# Patient Record
Sex: Female | Born: 1970
Health system: Southern US, Community
[De-identification: ages and names within clinical notes are randomized; demographics above are authoritative.]

## PROBLEM LIST (undated history)

## (undated) HISTORY — PX: LEEP: SHX91

## (undated) HISTORY — PX: TONSILLECTOMY: SUR1361

---

## 2018-02-20 NOTE — Progress Notes (Signed)
Loretta ScaleZach Yulissa Walker D.O. [facility] [address] [place], [place] [address] Phone: [phone] Subjective:    I Loretta NighKana Walker am serving as a Neurosurgeonscribe for Dr. Antoine PrimasZachary Sten Walker.    CC: Neck pain  BJY:[id number]:Subjective  Loretta Walker is a 48 y.o. female coming in with complaint of neck pain for one year. Has pain with sitting for too long. Patient states that pain can switch from side to side. Patient was underneath her table painting it and hyperextended her neck. Has had massage which did not help. Patient does have history of migraines and does get headaches from massage. Denies any radiating symptoms.       History reviewed. No pertinent past medical history. History reviewed. No pertinent surgical history. Social History   Socioeconomic History  . Marital status: Married    Spouse name: Not on file  . Number of children: Not on file  . Years of education: Not on file  . Highest education level: Not on file  Occupational History  . Not on file  Social Needs  . Financial resource strain: Not on file  . Food insecurity:    Worry: Not on file    Inability: Not on file  . Transportation needs:    Medical: Not on file    Non-medical: Not on file  Tobacco Use  . Smoking status: Not on file  Substance and Sexual Activity  . Alcohol use: Not on file  . Drug use: Not on file  . Sexual activity: Not on file  Lifestyle  . Physical activity:    Days per week: Not on file    Minutes per session: Not on file  . Stress: Not on file  Relationships  . Social connections:    Talks on phone: Not on file    Gets together: Not on file    Attends religious service: Not on file    Active member of club or organization: Not on file    Attends meetings of clubs or organizations: Not on file    Relationship status: Not on file  Other Topics Concern  . Not on file  Social History Narrative  . Not on file   Not on File History reviewed. No pertinent family  history.       Current Outpatient Medications (Other):  Marland Kitchen.  Vitamin D, Ergocalciferol, (DRISDOL) 1.25 MG (50000 UT) CAPS capsule, Take 1 capsule (50,000 Units total) by mouth every 7 (seven) days.    Past medical history, social, surgical and family history all reviewed in electronic medical record.  No pertanent information unless stated regarding to the chief complaint.   Review of Systems:  No  visual changes, nausea, vomiting, diarrhea, constipation, dizziness, abdominal pain, skin rash, fevers, chills, night sweats, weight loss, swollen lymph nodes, body aches, joint swelling, chest pain, shortness of breath, mood changes.  Positive muscle aches, headache   Objective  Blood pressure 128/86, pulse 76, height 5' 5.5" (1.664 m), weight 134 lb (60.8 kg), SpO2 99 %.   General: No apparent distress alert and oriented x3 mood and affect normal, dressed appropriately.  HEENT: Pupils equal, extraocular movements intact  Respiratory: Patient's speak in full sentences and does not appear short of breath  Cardiovascular: No lower extremity edema, non tender, no erythema  Skin: Warm dry intact with no signs of infection or rash on extremities or on axial skeleton.  Abdomen: Soft nontender  Neuro: Cranial nerves II through XII are intact, neurovascularly intact in all extremities with 2+  DTRs and 2+ pulses.  Lymph: No lymphadenopathy of posterior or anterior cervical chain or axillae bilaterally.  Gait normal with good balance and coordination.  MSK:  Non tender with full range of motion and good stability and symmetric strength and tone of shoulders, elbows, wrist, hip, knee and ankles bilaterally.  Neck: Inspection left lordosis. No palpable stepoffs. Negative Spurling's maneuver. Limited range of motion lacks 5-10 degrees Grip strength and sensation normal in bilateral hands Strength good C4 to T1 distribution No sensory change to C4 to T1 Negative Hoffman sign bilaterally Reflexes  normal Tightness noted bilaterally trigger point right   [id number]; 15 additional minutes spent for Therapeutic exercises as stated in above notes.  This included exercises focusing on stretching, strengthening, with significant focus on eccentric aspects.   Long term goals include an improvement in range of motion, strength, endurance as well as avoiding reinjury. Patient's frequency would include in 1-2 times a day, 3-5 times a week for a duration of 6-12 weeks.   Exercises that included:  Basic scapular stabilization to include adduction and depression of scapula Scaption, focusing on proper movement and good control Internal and External rotation utilizing a theraband, with elbow tucked at side entire time Rows with theraband given  Proper technique shown and discussed handout in great detail with ATC.  All questions were discussed and answered.      Impression and Recommendations:     This case required medical decision making of moderate complexity. The above documentation has been reviewed and is accurate and complete Loretta Saa, DO       Note: This dictation was prepared with Dragon dictation along with smaller phrase technology. Any transcriptional errors that result from this process are unintentional.

## 2018-02-21 ENCOUNTER — Ambulatory Visit: Payer: Managed Care, Other (non HMO) | Admitting: Family Medicine

## 2018-02-21 ENCOUNTER — Encounter: Payer: Self-pay | Admitting: Family Medicine

## 2018-02-21 ENCOUNTER — Encounter (INDEPENDENT_AMBULATORY_CARE_PROVIDER_SITE_OTHER): Payer: Self-pay

## 2018-02-21 DIAGNOSIS — M542 Cervicalgia: Secondary | ICD-10-CM | POA: Insufficient documentation

## 2018-02-21 MED ORDER — VITAMIN D (ERGOCALCIFEROL) 1.25 MG (50000 UNIT) PO CAPS: 50000.0000 [IU] | ORAL_CAPSULE | ORAL | 0 refills | Status: DC

## 2018-02-21 NOTE — Assessment & Plan Note (Signed)
Neck pain.  Patient has had this for approximately a year.  Patient declined anything such as x-rays at this moment.  Not having any radicular symptoms.  Significant tightness in the parascapular region.  I believe that there is more of a multifactorial problem here.  Some of it is posture related, muscle imbalances, as well as anxiety playing a role.  Started with home exercises, work with Event organiser to learn them in greater detail.  Handouts given.  Patient wanted to do more of a natural approach once weekly vitamin D for muscle strength and endurance, patient also is going to do some over-the-counter topicals.  Follow-up with me again in 4 weeks to see how patient is responding

## 2018-02-21 NOTE — Patient Instructions (Addendum)
Good to see you  Ice 20 minutes 2 times daily. Usually after activity and before bed. Exercises 3 times a week.  Keep hands Wirthlin peripheral vision  2 tennis ball in a tube sock and lay on them where the head meets the neck  Once weekly vitamin D for next 8 weeks  Arnica lotion 2 times a day  See me again in 4 weeks and if doing better then see if manipulation will be a good idea

## 2018-03-21 ENCOUNTER — Ambulatory Visit: Payer: Managed Care, Other (non HMO) | Admitting: Family Medicine

## 2018-04-03 ENCOUNTER — Ambulatory Visit: Payer: Managed Care, Other (non HMO) | Admitting: Family Medicine

## 2018-12-09 ENCOUNTER — Other Ambulatory Visit: Payer: Self-pay | Admitting: Family Medicine

## 2018-12-09 DIAGNOSIS — E049 Nontoxic goiter, unspecified: Secondary | ICD-10-CM

## 2018-12-18 ENCOUNTER — Ambulatory Visit
Admission: RE | Admit: 2018-12-18 | Discharge: 2018-12-18 | Disposition: A | Discharge status: Home / Self Care | Payer: Managed Care, Other (non HMO) | Source: Ambulatory Visit | Attending: Family Medicine | Admitting: Family Medicine

## 2018-12-18 DIAGNOSIS — E049 Nontoxic goiter, unspecified: Secondary | ICD-10-CM

## 2018-12-24 ENCOUNTER — Other Ambulatory Visit: Payer: Self-pay | Admitting: Endocrinology

## 2018-12-24 ENCOUNTER — Other Ambulatory Visit: Payer: Self-pay | Admitting: Family Medicine

## 2018-12-24 DIAGNOSIS — E041 Nontoxic single thyroid nodule: Secondary | ICD-10-CM

## 2018-12-25 ENCOUNTER — Other Ambulatory Visit: Payer: Self-pay | Admitting: Endocrinology

## 2018-12-25 DIAGNOSIS — E041 Nontoxic single thyroid nodule: Secondary | ICD-10-CM

## 2019-01-08 ENCOUNTER — Other Ambulatory Visit: Payer: Managed Care, Other (non HMO)

## 2019-01-13 ENCOUNTER — Other Ambulatory Visit: Payer: Self-pay | Admitting: Endocrinology

## 2019-01-13 DIAGNOSIS — E041 Nontoxic single thyroid nodule: Secondary | ICD-10-CM

## 2019-03-26 ENCOUNTER — Ambulatory Visit
Admission: RE | Admit: 2019-03-26 | Discharge: 2019-03-26 | Disposition: A | Discharge status: Home / Self Care | Payer: Managed Care, Other (non HMO) | Source: Ambulatory Visit | Attending: Endocrinology | Admitting: Endocrinology

## 2019-03-26 DIAGNOSIS — E041 Nontoxic single thyroid nodule: Secondary | ICD-10-CM

## 2019-04-04 ENCOUNTER — Other Ambulatory Visit: Payer: Self-pay | Admitting: Endocrinology

## 2019-04-04 DIAGNOSIS — E041 Nontoxic single thyroid nodule: Secondary | ICD-10-CM

## 2020-02-12 DIAGNOSIS — L814 Other melanin hyperpigmentation: Secondary | ICD-10-CM | POA: Diagnosis not present

## 2020-02-12 DIAGNOSIS — L905 Scar conditions and fibrosis of skin: Secondary | ICD-10-CM | POA: Diagnosis not present

## 2020-02-12 DIAGNOSIS — B078 Other viral warts: Secondary | ICD-10-CM | POA: Diagnosis not present

## 2020-02-12 DIAGNOSIS — D229 Melanocytic nevi, unspecified: Secondary | ICD-10-CM | POA: Diagnosis not present

## 2020-02-12 DIAGNOSIS — L821 Other seborrheic keratosis: Secondary | ICD-10-CM | POA: Diagnosis not present

## 2020-10-28 DIAGNOSIS — L814 Other melanin hyperpigmentation: Secondary | ICD-10-CM | POA: Diagnosis not present

## 2020-10-28 DIAGNOSIS — L821 Other seborrheic keratosis: Secondary | ICD-10-CM | POA: Diagnosis not present

## 2020-10-28 DIAGNOSIS — D2371 Other benign neoplasm of skin of right lower limb, including hip: Secondary | ICD-10-CM | POA: Diagnosis not present

## 2020-10-28 DIAGNOSIS — L720 Epidermal cyst: Secondary | ICD-10-CM | POA: Diagnosis not present

## 2020-11-23 DIAGNOSIS — L815 Leukoderma, not elsewhere classified: Secondary | ICD-10-CM | POA: Diagnosis not present

## 2020-11-23 DIAGNOSIS — L820 Inflamed seborrheic keratosis: Secondary | ICD-10-CM | POA: Diagnosis not present

## 2020-11-23 DIAGNOSIS — L298 Other pruritus: Secondary | ICD-10-CM | POA: Diagnosis not present

## 2020-11-23 DIAGNOSIS — D2371 Other benign neoplasm of skin of right lower limb, including hip: Secondary | ICD-10-CM | POA: Diagnosis not present

## 2020-11-23 DIAGNOSIS — R208 Other disturbances of skin sensation: Secondary | ICD-10-CM | POA: Diagnosis not present

## 2020-11-23 DIAGNOSIS — L538 Other specified erythematous conditions: Secondary | ICD-10-CM | POA: Diagnosis not present

## 2020-12-20 ENCOUNTER — Other Ambulatory Visit (HOSPITAL_COMMUNITY)
Admission: RE | Admit: 2020-12-20 | Discharge: 2020-12-20 | Disposition: A | Discharge status: Home / Self Care | Payer: BC Managed Care – PPO | Source: Ambulatory Visit | Attending: Obstetrics & Gynecology | Admitting: Obstetrics & Gynecology

## 2020-12-20 ENCOUNTER — Encounter: Payer: Self-pay | Admitting: Obstetrics & Gynecology

## 2020-12-20 ENCOUNTER — Ambulatory Visit (INDEPENDENT_AMBULATORY_CARE_PROVIDER_SITE_OTHER): Payer: BC Managed Care – PPO | Admitting: Obstetrics & Gynecology

## 2020-12-20 ENCOUNTER — Other Ambulatory Visit: Payer: Self-pay

## 2020-12-20 VITALS — BP 132/69 | HR 75 | Ht 65.25 in | Wt 125.0 lb

## 2020-12-20 DIAGNOSIS — Z01419 Encounter for gynecological examination (general) (routine) without abnormal findings: Secondary | ICD-10-CM | POA: Diagnosis not present

## 2020-12-20 DIAGNOSIS — Z1231 Encounter for screening mammogram for malignant neoplasm of breast: Secondary | ICD-10-CM | POA: Diagnosis not present

## 2020-12-20 DIAGNOSIS — Z1211 Encounter for screening for malignant neoplasm of colon: Secondary | ICD-10-CM

## 2020-12-20 NOTE — Patient Instructions (Signed)
Please schedule a mammogram at one of the following locations:  [place]: [phone]  [facility] [place]:[phone] [address]  

## 2020-12-20 NOTE — Progress Notes (Signed)
   WELL-WOMAN EXAMINATION Patient name: Loretta Walker MRN [id number]  Date of birth: [birth year] Chief Complaint:   Gynecologic Exam  History of Present Illness:   Loretta Walker is a 50 y.o. PM female being seen today for a routine well-woman exam.   Today she notes: no acute complaints or concerns  Of note, not sexually active x 4-48yrs.  Notes considerable dyspareunia- tried medication with no improvement.  Pt requesting small speculum and fast exam as she is very concerned about discomfort.  Referral from [facility].  Pt may be moving to [place], [place] to be closer to her twin sister  No LMP recorded. Patient is postmenopausal.   Last pap ~52yrs ago.  Last mammogram: Declines mammogram and prefers thermography- this has been done yearly. Last colonoscopy: screening options reviewed- interested in cologuard   Depression screen [facility] 2/9 12/20/2020  Decreased Interest 0  Down, Depressed, Hopeless 1  PHQ - 2 Score 1  Altered sleeping 1  Tired, decreased energy 1  Change in appetite 0  Feeling bad or failure about yourself  0  Trouble concentrating 1  Moving slowly or fidgety/restless 0  Suicidal thoughts 0  PHQ-9 Score 4    Review of Systems:   Pertinent items are noted in HPI Denies any headaches, blurred vision, fatigue, shortness of breath, chest pain, abdominal pain, bowel movements, urination.  Pertinent History Reviewed:  Reviewed past medical,surgical, social and family history.  Reviewed problem list, medications and allergies. Physical Assessment:   Vitals:   12/20/20 1130  BP: 132/69  Pulse: 75  Weight: 125 lb (56.7 kg)  Height: 5' 5.25" (1.657 m)  Body mass index is 20.64 kg/m.        Physical Examination:   General appearance - well appearing, and in no distress  Mental status - alert, oriented to person, place, and time  Psych:  She has a normal mood and affect  Skin - warm and dry, normal color, no suspicious lesions noted  Chest -  effort normal, all lung fields clear to auscultation bilaterally  Heart - normal rate and regular rhythm  Neck:  midline trachea, no thyromegaly or nodules  Breasts - breasts appear normal, no suspicious masses, no skin or nipple changes or  axillary nodes  Abdomen - soft, nontender, nondistended, no masses or organomegaly  Pelvic - VULVA: normal appearing vulva with no masses, tenderness or lesions  VAGINA: narrowed introitus, immediately with placement of speculum noted considerable discomfort, atrophic changed noted, CERVIX: normal appearing cervix without discharge or lesions, no CMT.  Exam limited due to discomfort. **SMALL SPECULUM USED  Thin prep pap is done with HR HPV cotesting  UTERUS: uterus is felt to be normal size, shape, consistency and nontender   ADNEXA: No adnexal masses or tenderness noted.  Extremities:  No swelling or varicosities noted  Chaperone: Faith Rogue     Assessment & Plan:  1) Well-Woman Exam -pap collected, concern for inadequate sampling due to difficulty with exam -pt declined mammogram though ordered placed should pt change her mind -Cologuard ordered   Orders Placed This Encounter  Procedures   MM 3D SCREEN BREAST BILATERAL   Cologuard    Meds: No orders of the defined types were placed in this encounter.   Follow-up: Return in about 2 years (around 12/21/2022) for annual.   Myna Hidalgo, DO Attending Obstetrician & Gynecologist, [facility] for [employer], [facility]

## 2020-12-21 LAB — CYTOLOGY - PAP
Comment: NEGATIVE
Diagnosis: NEGATIVE
High risk HPV: NEGATIVE

## 2020-12-23 ENCOUNTER — Telehealth: Payer: Self-pay

## 2020-12-23 NOTE — Telephone Encounter (Signed)
-----   Message from Annamarie Dawley, LPN sent at [phone]  5:09 PM EST -----  ----- Message ----- From: Myna Hidalgo, DO Sent: 12/22/2020   8:17 AM EST To: Annamarie Dawley, LPN  Pap/HPV negative

## 2020-12-23 NOTE — Telephone Encounter (Signed)
Pt returned vm. Two identifiers used. Pt given test results and confirmed understanding.

## 2020-12-23 NOTE — Telephone Encounter (Signed)
2nd attempt to reach pt regarding test results, no answer, left vm

## 2021-02-13 DIAGNOSIS — J069 Acute upper respiratory infection, unspecified: Secondary | ICD-10-CM | POA: Diagnosis not present

## 2021-03-10 DIAGNOSIS — F4322 Adjustment disorder with anxiety: Secondary | ICD-10-CM | POA: Diagnosis not present

## 2021-03-17 DIAGNOSIS — F4322 Adjustment disorder with anxiety: Secondary | ICD-10-CM | POA: Diagnosis not present

## 2021-03-20 IMAGING — US US THYROID
1 series · 12 of 25 positions shown · non-contrast
Comparison: None.

CLINICAL DATA: Goiter

EXAM:
THYROID ULTRASOUND
TECHNIQUE: Ultrasound examination of the thyroid gland and adjacent soft
tissues was performed.

[Series 1: us thyroid · 0.06mm/px · 12 of 73 slices shown]
[im 4/73]
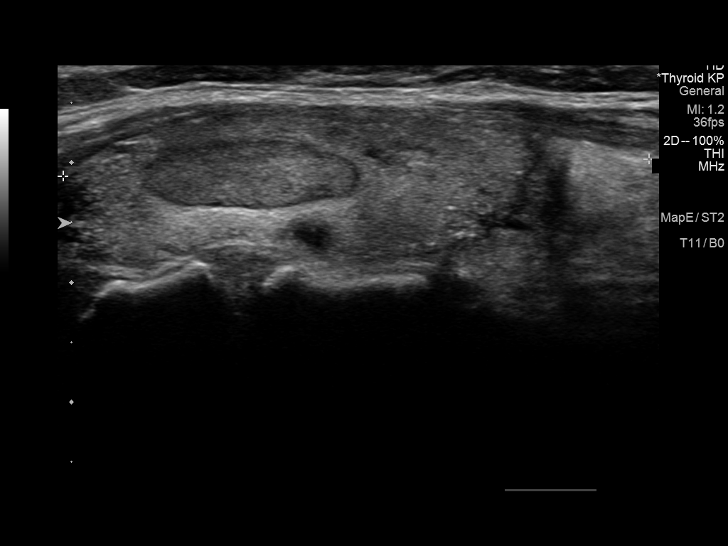
[im 10/73]
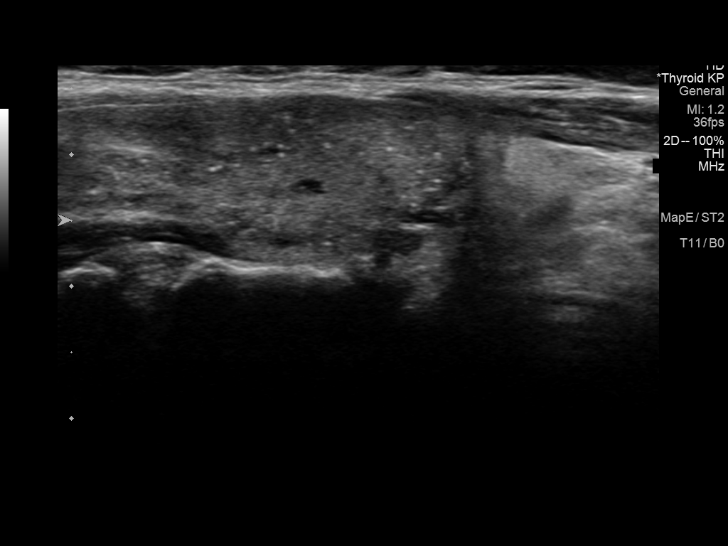
[im 16/73]
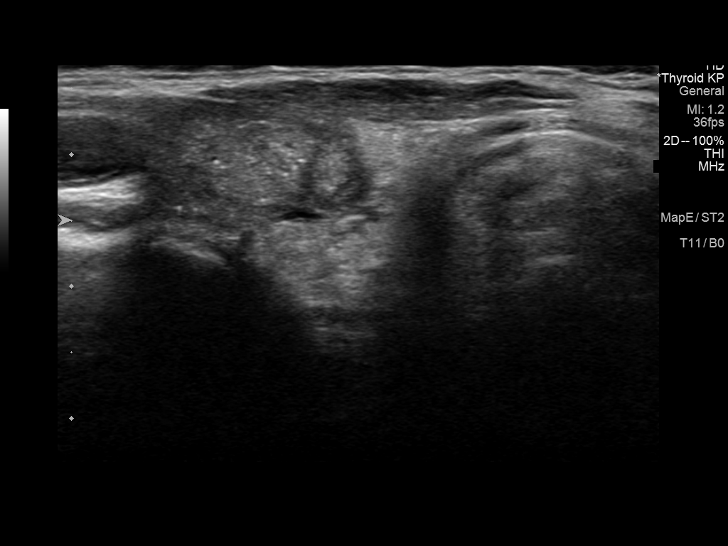
[im 22/73]
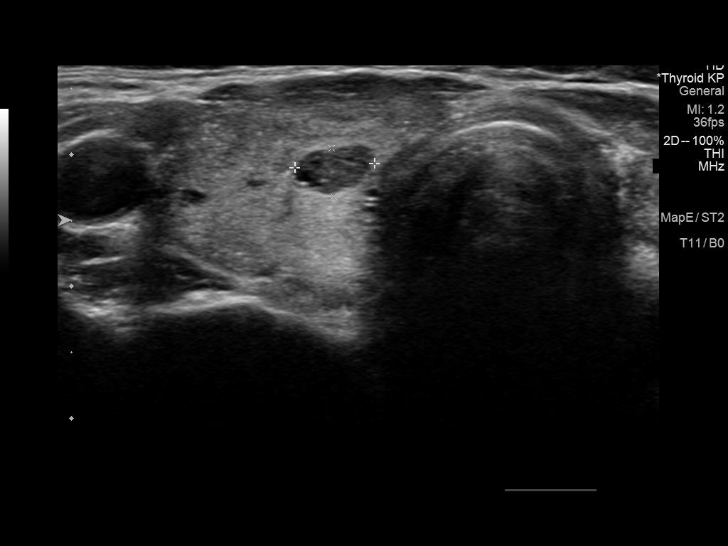
[im 28/73]
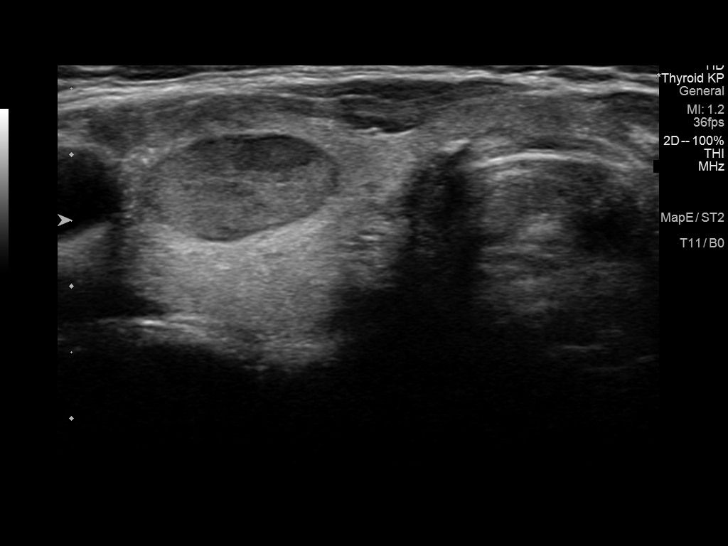
[im 34/73]
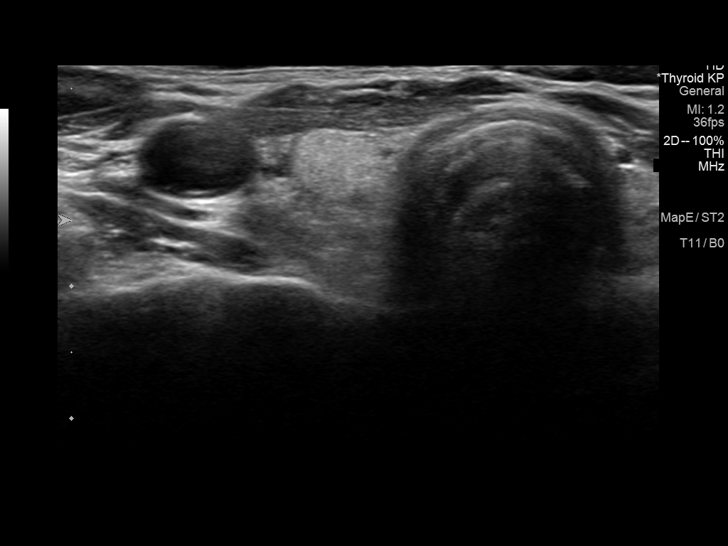
[im 40/73]
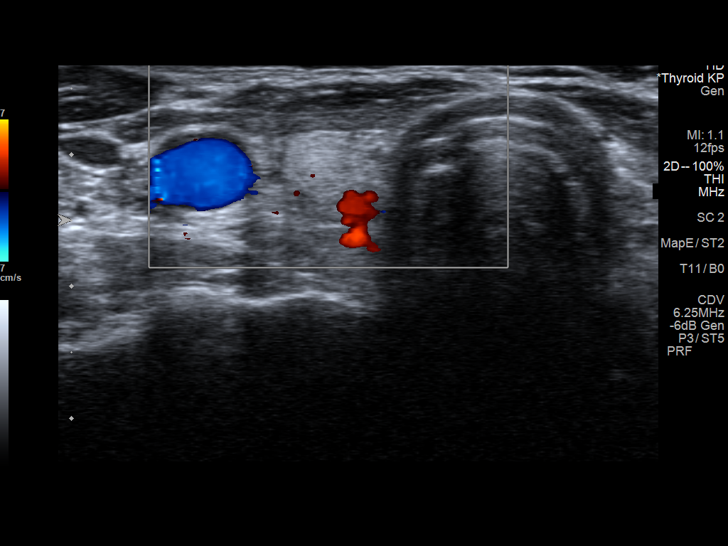
[im 46/73]
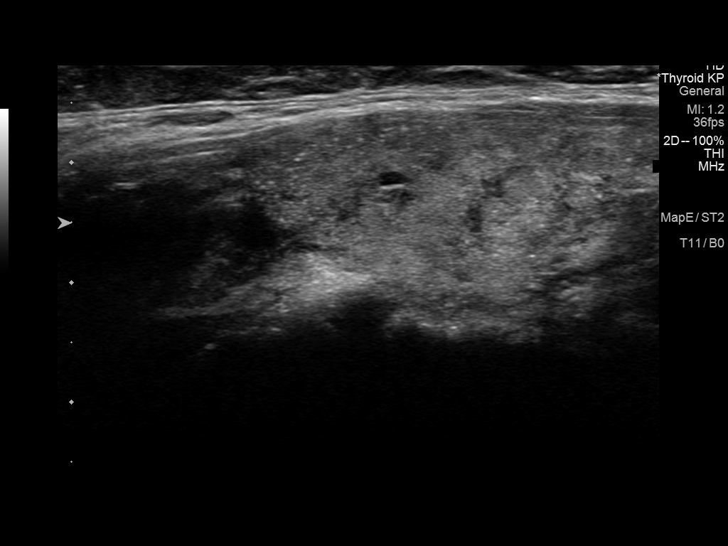
[im 52/73]
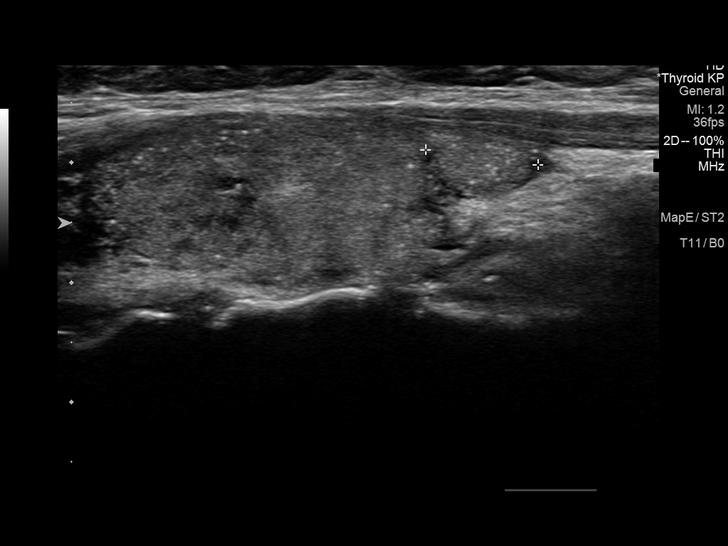
[im 58/73]
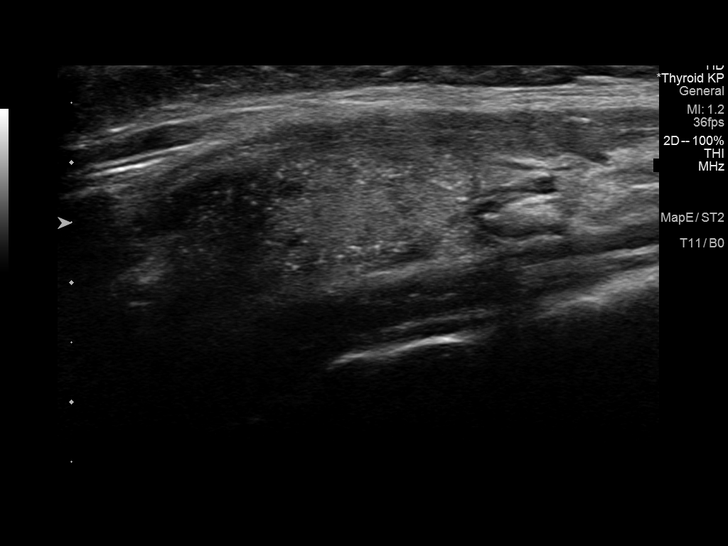
[im 64/73]
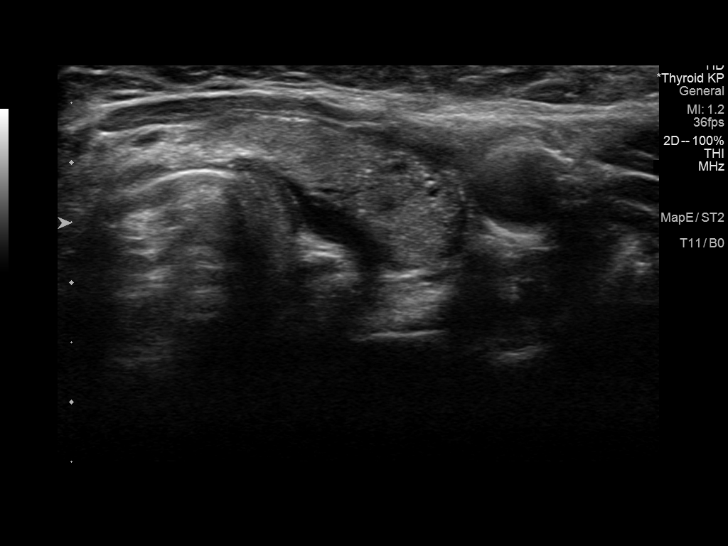
[im 70/73]
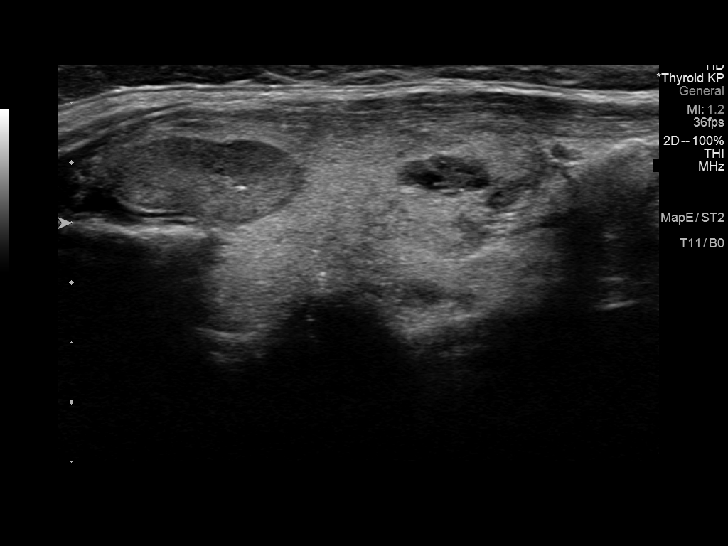

[12 of 25 positions shown; findings below may reference images not displayed]

FINDINGS: Parenchymal Echotexture: Moderately heterogenous

Isthmus: 0.3 cm

Right lobe: 4.9 x 1.7 x 2.2 cm

Left lobe: 3.9 x 1.4 x 1.5 cm

_________________________________________________________

Estimated total number of nodules >/= 1 cm: 4

Number of spongiform nodules >/=  2 cm not described below (TR1): 0

Number of mixed cystic and solid nodules >/= 1.5 cm not described
below (TR2): 0

_________________________________________________________

Nodule # 1:

Location: Right; Superior

Maximum size: 1.8 cm; Other 2 dimensions: 0.8 x 1.2 cm

Composition: solid/almost completely solid (2)

Echogenicity: hypoechoic (2)

Shape: not taller-than-wide (0)

Margins: smooth (0)

Echogenic foci: none (0)

ACR TI-RADS total points: 4.

ACR TI-RADS risk category: TR4 (4-6 points).

ACR TI-RADS recommendations:

**Given size (>/= 1.5 cm) and appearance, fine needle aspiration of
this moderately suspicious nodule should be considered based on
TI-RADS criteria.

_________________________________________________________

Nodule # 2:

Location: Right; Inferior

Maximum size: 1 cm; Other 2 dimensions: 0.4 x 0.8 cm

Composition: solid/almost completely solid (2)

Echogenicity: very hypoechoic (3)

Shape: not taller-than-wide (0)

Margins: smooth (0)

Echogenic foci: none (0)

ACR TI-RADS total points: 5.

ACR TI-RADS risk category: TR4 (4-6 points).

ACR TI-RADS recommendations:

*Given size (>/= 1 - 1.4 cm) and appearance, a follow-up ultrasound
in 1 year should be considered based on TI-RADS criteria.

_________________________________________________________

Nodule # 3:

Location: Right; Inferior

Maximum size: 2.1 cm; Other 2 dimensions: 0.9 x 0.9 cm

Composition: solid/almost completely solid (2)

Echogenicity: hypoechoic (2)

Shape: not taller-than-wide (0)

Margins: smooth (0)

Echogenic foci: punctate echogenic foci (3)

ACR TI-RADS total points: 7.

ACR TI-RADS risk category: TR5 (>/= 7 points).

ACR TI-RADS recommendations:

**Given size (>/= 1.0 cm) and appearance, fine needle aspiration of
this highly suspicious nodule should be considered based on TI-RADS
criteria.

_________________________________________________________

Nodule # 4:

Location: Left; Inferior

Maximum size: 1 cm; Other 2 dimensions: 0.5 x 0 point cm

Composition: solid/almost completely solid (2)

Echogenicity: hypoechoic (2)

Shape: not taller-than-wide (0)

Margins: smooth (0)

Echogenic foci: punctate echogenic foci (3)

ACR TI-RADS total points: 7.

ACR TI-RADS risk category: TR5 (>/= 7 points).

ACR TI-RADS recommendations:

**Given size (>/= 1.0 cm) and appearance, fine needle aspiration of
this highly suspicious nodule should be considered based on TI-RADS
criteria.

_________________________________________________________
No enlarged lymph nodes were identified.
IMPRESSION: 1. Moderately heterogeneous thyroid gland with multiple distinct
thyroid nodules as detailed above.
2. Nodules labeled #1, 3 and 4 meet criteria for fine-needle
aspiration. Nodules labeled #3 in the right inferior thyroid gland
and #4 in the left inferior thyroid gland are the most suspicious on
today's exam.
3. Nodule labeled #2 meets criteria for 1 year follow-up ultrasound.

The above is in keeping with the ACR TI-RADS recommendations - [HOSPITAL] 3376;[DATE].

## 2021-03-24 DIAGNOSIS — F4322 Adjustment disorder with anxiety: Secondary | ICD-10-CM | POA: Diagnosis not present

## 2021-03-31 DIAGNOSIS — F4322 Adjustment disorder with anxiety: Secondary | ICD-10-CM | POA: Diagnosis not present

## 2021-04-11 DIAGNOSIS — H02811 Retained foreign body in right upper eyelid: Secondary | ICD-10-CM | POA: Diagnosis not present

## 2021-04-14 DIAGNOSIS — F4322 Adjustment disorder with anxiety: Secondary | ICD-10-CM | POA: Diagnosis not present

## 2021-10-13 DIAGNOSIS — D225 Melanocytic nevi of trunk: Secondary | ICD-10-CM | POA: Diagnosis not present

## 2021-10-13 DIAGNOSIS — L814 Other melanin hyperpigmentation: Secondary | ICD-10-CM | POA: Diagnosis not present

## 2021-10-13 DIAGNOSIS — L821 Other seborrheic keratosis: Secondary | ICD-10-CM | POA: Diagnosis not present

## 2022-06-07 ENCOUNTER — Ambulatory Visit (HOSPITAL_COMMUNITY): Payer: Self-pay

## 2022-06-08 ENCOUNTER — Telehealth (HOSPITAL_COMMUNITY): Payer: Self-pay | Admitting: Emergency Medicine

## 2022-06-08 ENCOUNTER — Ambulatory Visit (HOSPITAL_COMMUNITY)
Admission: RE | Admit: 2022-06-08 | Discharge: 2022-06-08 | Disposition: A | Discharge status: Home / Self Care | Payer: BC Managed Care – PPO | Source: Ambulatory Visit | Attending: Emergency Medicine | Admitting: Emergency Medicine

## 2022-06-08 ENCOUNTER — Encounter (HOSPITAL_COMMUNITY): Payer: Self-pay

## 2022-06-08 VITALS — BP 113/64 | HR 91 | Temp 98.0°F | Resp 17

## 2022-06-08 DIAGNOSIS — J0140 Acute pansinusitis, unspecified: Secondary | ICD-10-CM | POA: Diagnosis not present

## 2022-06-08 MED ORDER — MOMETASONE FUROATE 50 MCG/ACT NA SUSP: 2.0000 | Freq: Every day | NASAL | 0 refills | Status: DC

## 2022-06-08 MED ORDER — ONDANSETRON 8 MG PO TBDP: ORAL_TABLET | ORAL | 0 refills | Status: DC

## 2022-06-08 MED ORDER — NAPROXEN 500 MG PO TABS: 500.0000 mg | ORAL_TABLET | Freq: Two times a day (BID) | ORAL | 0 refills | Status: DC

## 2022-06-08 MED ORDER — AZITHROMYCIN 200 MG/5ML PO SUSR: 500.0000 mg | Freq: Every day | ORAL | 0 refills | Status: AC

## 2022-06-08 MED ORDER — DOXYCYCLINE MONOHYDRATE 25 MG/5ML PO SUSR: 100.0000 mg | Freq: Two times a day (BID) | ORAL | 0 refills | Status: AC

## 2022-06-08 NOTE — Telephone Encounter (Signed)
Patient called, requesting azithromycin due to doxycycline allergy.  Will call in 5 days of azithromycin, 500 mg on day 1, 250 mg on days 2 through 4.

## 2022-06-08 NOTE — ED Triage Notes (Signed)
Pt reports a cough, headache, jaw soreness, head pressure, nasal congestion and fatigue since Tuesday. States she vomited all night on Monday and symptoms started with a sore throat on Saturday night. Sore throat has since then resolved. Has tried an Ibuprofen and flu and cough medication.

## 2022-06-08 NOTE — Discharge Instructions (Signed)
Start Mucinex-D to keep the mucous thin and to decongest you.   You may take the Naprosyn with 1000 mg of Tylenol twice a day as needed for headache, pain this is an effective combination for pain.  Finish the doxycycline, even if you feel better.  Try the Nasonex.  Use a NeilMed sinus rinse with distilled water as often as you want to to reduce nasal congestion. Follow the directions on the box.   Zofran is in case you start having vomiting and diarrhea again.  Make sure you push plenty of extra electrolyte containing fluids  Go to www.goodrx.com to look up your medications. This will give you a list of where you can find your prescriptions at the most affordable prices. Or you can ask the pharmacist what the cash price is. This is frequently cheaper than going through insurance.

## 2022-06-08 NOTE — ED Provider Notes (Signed)
HPI  SUBJECTIVE:  Loretta Walker is a 52 y.o. female who presents with sore throat for 6 days, right-sided headache, right-sided sinus pain and pressure, nasal congestion, clear rhinorrhea, chest congestion, cough productive of green sputum, right jaw pain.  She reports vomiting and diarrhea 3 days ago, but this has resolved.  She is tolerating p.o. now.  No upper dental pain, facial swelling, fevers, postnasal drip.  No wheezing, shortness of breath.  No antibiotics in the past month.  She has been taking ibuprofen 200 mg, and unknown cold and flu medication and ice.  The ibuprofen helps.  Symptoms are worse with bending forward.  She has a past medical history of migraines, status post tonsillectomy and sinusitis.  No history of TMJ arthralgia. PCP: None.    History reviewed. No pertinent past medical history.  Past Surgical History:  Procedure Laterality Date   LEEP     TONSILLECTOMY      Family History  Problem Relation Age of Onset   Breast cancer Maternal Grandmother    Heart disease Mother     Social History   Tobacco Use   Smoking status: Never    Passive exposure: Never   Smokeless tobacco: Never  Vaping Use   Vaping Use: Never used  Substance Use Topics   Alcohol use: Never   Drug use: Never    No current facility-administered medications for this encounter.  Current Outpatient Medications:    doxycycline (VIBRAMYCIN) 25 MG/5ML SUSR, Take 20 mLs (100 mg total) by mouth 2 (two) times daily for 10 days., Disp: 240 mL, Rfl: 0   mometasone (NASONEX) 50 MCG/ACT nasal spray, Place 2 sprays into the nose daily., Disp: 17 g, Rfl: 0   naproxen (NAPROSYN) 500 MG tablet, Take 1 tablet (500 mg total) by mouth 2 (two) times daily., Disp: 20 tablet, Rfl: 0   ondansetron (ZOFRAN-ODT) 8 MG disintegrating tablet, 1/2- 1 tablet q 8 hr prn nausea, vomiting, Disp: 20 tablet, Rfl: 0   azithromycin (ZITHROMAX) 200 MG/5ML suspension, Take 12.5 mLs (500 mg total) by mouth daily for 4  days. Take 12.5 mL on day #1, 6 mL on days 2 through 4, Disp: 50 mL, Rfl: 0  Allergies  Allergen Reactions   Codeine Rash   Doxycycline Rash   Latex Rash   Penicillins Rash     ROS  As noted in HPI.   Physical Exam  BP 113/64 (BP Location: Right Arm)   Pulse 91   Temp 98 F (36.7 C) (Oral)   Resp 17   SpO2 98%   Constitutional: Well developed, well nourished, no acute distress Eyes:  EOMI, conjunctiva normal bilaterally HENT: Normocephalic, atraumatic,mucus membranes moist.  Right external ear, EAC, TM normal.  No tenderness over the TMJ.  No pain with traction on pinna, palpation of tragus or mastoid.  Erythematous, swollen turbinates with nasal congestion.  No maxillary, frontal sinus tenderness.  Positive cobblestoning.  Tonsils surgically absent. Respiratory: Normal inspiratory effort bilaterally, good air movement Cardiovascular: Normal rate, regular rhythm, no murmurs GI: nondistended skin: No rash, skin intact Musculoskeletal: no deformities Neurologic: Alert & oriented x 3, no focal neuro deficits Psychiatric: Speech and behavior appropriate   ED Course   Medications - No data to display  No orders of the defined types were placed in this encounter.   No results found for this or any previous visit (from the past 24 hour(s)). No results found.  ED Clinical Impression  1. Acute non-recurrent pansinusitis  ED Assessment/Plan     Patient states that she has tolerated doxycycline before.   Presentation consistent with upper respiratory infection that has turned into a sinus infection.  Will send home with 10 days of doxycycline liquid as she states she cannot take pills, saline nasal irrigation, Naprosyn/Tylenol, Nasonex, Mucinex D.  States Flonase gives her migraines.  Will also send home with Zofran in case the vomiting and diarrhea return.  She states this has resolved for the past few days.  She will follow-up with a primary care provider of her  choice.  She declined primary care list.  1500-patient called requesting azithromycin due to doxycycline allergy.  Will E prescribe azithromycin although this may not be as effective as the doxycycline.  See telephone note.  Discussed  MDM, treatment plan, and plan for follow-up with patient. Discussed sn/sx that should prompt return to the ED. patient agrees with plan.   Meds ordered this encounter  Medications   mometasone (NASONEX) 50 MCG/ACT nasal spray    Sig: Place 2 sprays into the nose daily.    Dispense:  17 g    Refill:  0   naproxen (NAPROSYN) 500 MG tablet    Sig: Take 1 tablet (500 mg total) by mouth 2 (two) times daily.    Dispense:  20 tablet    Refill:  0   doxycycline (VIBRAMYCIN) 25 MG/5ML SUSR    Sig: Take 20 mLs (100 mg total) by mouth 2 (two) times daily for 10 days.    Dispense:  240 mL    Refill:  0   ondansetron (ZOFRAN-ODT) 8 MG disintegrating tablet    Sig: 1/2- 1 tablet q 8 hr prn nausea, vomiting    Dispense:  20 tablet    Refill:  0      *This clinic note was created using Scientist, clinical (histocompatibility and immunogenetics). Therefore, there may be occasional mistakes despite careful proofreading.  ?    Domenick Gong, MD 06/10/22 1144

## 2022-06-29 ENCOUNTER — Encounter: Payer: Self-pay | Admitting: Family

## 2022-06-29 ENCOUNTER — Ambulatory Visit: Payer: BC Managed Care – PPO | Admitting: Family

## 2022-06-29 VITALS — BP 109/73 | HR 75 | Temp 97.3°F | Ht 65.0 in | Wt 129.1 lb

## 2022-06-29 DIAGNOSIS — K644 Residual hemorrhoidal skin tags: Secondary | ICD-10-CM

## 2022-06-29 DIAGNOSIS — G43009 Migraine without aura, not intractable, without status migrainosus: Secondary | ICD-10-CM

## 2022-06-29 MED ORDER — NAPROXEN 125 MG/5ML PO SUSP
500.0000 mg | ORAL | 0 refills | Status: DC
Start: 2022-06-29 — End: 2023-02-22

## 2022-06-29 MED ORDER — SUMATRIPTAN SUCCINATE 25 MG PO TABS: 25.0000 mg | ORAL_TABLET | ORAL | 0 refills | Status: DC | PRN

## 2022-06-29 MED ORDER — NAPROXEN 500 MG PO TABS: 500.0000 mg | ORAL_TABLET | Freq: Every day | ORAL | 0 refills | Status: DC | PRN

## 2022-06-29 MED ORDER — SUMATRIPTAN 10 MG/ACT NA SOLN
1.0000 | NASAL | 2 refills | Status: AC
Start: 2022-06-29 — End: ?

## 2022-06-29 MED ORDER — NAPROXEN 500 MG PO TABS: 500.0000 mg | ORAL_TABLET | Freq: Two times a day (BID) | ORAL | 0 refills | Status: DC

## 2022-06-29 NOTE — Patient Instructions (Signed)
Welcome to [facility], It was a pleasure meeting you today!    As discussed, I have sent over the generic Imitrex to try for your next migraine. Take 1 pill with one Naproxen (also sent to pharmacy). Follow instructions on the Imitrex bottle.   Ask your dermatologist about removing the left over skin tag tissue from your old hemorroids.   You can schedule a physical with fasting l when convenient for you today.    PLEASE NOTE: If you had any LAB tests please let us know if you have not heard back within a few days. You may see your results on MyChart before we have a chance to review them but we will give you a call once they are reviewed by [place]. If we ordered any REFERRALS today, please let us know if you have not heard from their office within the next week.  Let us know through MyChart if you are needing REFILLS, or have your pharmacy send [place] the request. You can also use MyChart to communicate with me or any office staff.  Please try these tips to maintain a healthy lifestyle: It is important that you exercise regularly at least 30 minutes 5 times a week. Think about what you will eat, plan ahead. Choose whole foods, & think  "clean, green, fresh or frozen" over canned, processed or packaged foods which are more sugary, salty, and fatty. 70 to 75% of food eaten should be fresh vegetables and protein. 2-3  meals daily with healthy snacks between meals, but must be whole fruit, protein or vegetables. Aim to eat over a 10 hour period when you are active, for example, 7am to 5pm, and then STOP after your last meal of the day, drinking only water.  Shorter eating windows, 6-8 hours, are showing benefits in heart disease and blood sugar regulation. Drink water every day! Shoot for 64 ounces daily = 8 cups, no other drink is as healthy! Fruit juice is best enjoyed in a healthy way, by EATING the fruit.

## 2022-06-29 NOTE — Progress Notes (Signed)
New Patient Office Visit  Subjective:  Patient ID: Loretta Walker, female    DOB: 1970-06-26  Age: 52 y.o. MRN: [id number]  CC:  Chief Complaint  Patient presents with   New Patient (Initial Visit)    No concerns   Migraine    Discuss migraines, present for years. Once a month and usually lasts 3 days and head pressure.    Hemorrhoids    Pt c/o hemrrhoids, Has tried lidocaine wipes which does relieve It.    HPI Loretta Walker presents for establishing care today.  Migraines:  occurs about every 1-2 months and last 3 days, she thinks it is related to when the barometric pressure drops around storms or large amount of rainfall. Reports pain on right side and down the back of her head, sharp, stabbing pain. Has tried OTC meds including Excedrin migraine that does not really help, she has to lay down, can not work or focus, photosensitive. Denies nausea.  Hemorrhoid tissue:  pt states she does not have a current hemorrhoid but used to get them frequently in the past and now has extra loose skin/tissue around her rectum that she would like removed.   Assessment & Plan:  Migraine without aura and without status migrainosus, not intractable Assessment & Plan: chronic every month or other month, right side of head, sharp/stabbing, lasts for 3 days no OTC meds help never has tried RX med in past, was nervous about SE sending generic Imitrex nasal spray along with Naproxen suspension (pt unable to swallow pills), advised on use & SE. f/u 6 mos or prn  Orders: -     SUMAtriptan; Place 1 spray into the nose as directed. Spray into one nostril only. Repeat in 2 hours if needed, max dose of 4 sprays in 24 hours.  Dispense: 1 each; Refill: 2 -     Naproxen; Take 20 mLs (500 mg total) by mouth as directed. Take 20ml with sumatriptan nasal spray for migraines. Can repeat dose in 12 hours if needed.  Dispense: 150 mL; Refill: 0  Hemorrhoidal skin tags - did not assess, pt thinks  there are at least 2 different skin tags, reports they get irritated sometimes when she wipes her rectum. Denies any current hemorrhoids, pain or itching. States she now avoids constipation, has a soft BM daily. Advised pt to contact her DERM and see if able to do, if not, let me know and I can refer to general surgery.  Subjective:    Outpatient Medications Prior to Visit  Medication Sig Dispense Refill   mometasone (NASONEX) 50 MCG/ACT nasal spray Place 2 sprays into the nose daily. 17 g 0   naproxen (NAPROSYN) 500 MG tablet Take 1 tablet (500 mg total) by mouth 2 (two) times daily. 20 tablet 0   ondansetron (ZOFRAN-ODT) 8 MG disintegrating tablet 1/2- 1 tablet q 8 hr prn nausea, vomiting 20 tablet 0   No facility-administered medications prior to visit.   No past medical history on file. Past Surgical History:  Procedure Laterality Date   LEEP     TONSILLECTOMY      Objective:   Today's Vitals: BP 109/73   Pulse 75   Temp (!) 97.3 F (36.3 C) (Temporal)   Ht 5\' 5"  (1.651 m)   Wt 129 lb 2 oz (58.6 kg)   SpO2 97%   BMI 21.49 kg/m   Physical Exam Vitals and nursing note reviewed.  Constitutional:      Appearance: Normal appearance.  Cardiovascular:  Rate and Rhythm: Normal rate and regular rhythm.  Pulmonary:     Effort: Pulmonary effort is normal.     Breath sounds: Normal breath sounds.  Musculoskeletal:        General: Normal range of motion.  Skin:    General: Skin is warm and dry.  Neurological:     Mental Status: She is alert.  Psychiatric:        Mood and Affect: Mood normal.        Behavior: Behavior normal.     Meds ordered this encounter  Medications   DISCONTD: SUMAtriptan (IMITREX) 25 MG tablet    Sig: Take 1 tablet (25 mg total) by mouth every 2 (two) hours as needed for migraine. May repeat in 2 hours if headache persists or recurs.    Dispense:  10 tablet    Refill:  0    Order Specific Question:   Supervising Provider    Answer:   ANDY,  CAMILLE L [[id number]]   DISCONTD: naproxen (NAPROSYN) 500 MG tablet    Sig: Take 1 tablet (500 mg total) by mouth 2 (two) times daily with a meal.    Dispense:  30 tablet    Refill:  0    Order Specific Question:   Supervising Provider    Answer:   ANDY, CAMILLE L [[id number]]   DISCONTD: SUMAtriptan (IMITREX) 25 MG tablet    Sig: Take 1-2 tablets (25-50 mg total) by mouth every 2 (two) hours as needed for migraine (Max dose of 6 pills in 24 hours). May repeat in 2 hours if headache persists or recurs. Take with 1 Naproxen.    Dispense:  10 tablet    Refill:  0    Order Specific Question:   Supervising Provider    Answer:   ANDY, CAMILLE L [[id number]]   DISCONTD: naproxen (NAPROSYN) 500 MG tablet    Sig: Take 1 tablet (500 mg total) by mouth daily as needed for headache or moderate pain. Take with the Imitrex for migraine.    Dispense:  30 tablet    Refill:  0    Order Specific Question:   Supervising Provider    Answer:   ANDY, CAMILLE L [[id number]]   SUMAtriptan 10 MG/ACT SOLN    Sig: Place 1 spray into the nose as directed. Spray into one nostril only. Repeat in 2 hours if needed, max dose of 4 sprays in 24 hours.    Dispense:  1 each    Refill:  2    d/c sumatriptan pills    Order Specific Question:   Supervising Provider    Answer:   ANDY, CAMILLE L [[id number]]   naproxen (NAPROSYN) 125 MG/5ML suspension    Sig: Take 20 mLs (500 mg total) by mouth as directed. Take 20ml with sumatriptan nasal spray for migraines. Can repeat dose in 12 hours if needed.    Dispense:  150 mL    Refill:  0    d/c previous order for pills    Order Specific Question:   Supervising Provider    Answer:   ANDY, CAMILLE L [[id number]]    Dulce Sellar, NP

## 2022-06-29 NOTE — Assessment & Plan Note (Signed)
chronic every month or other month, right side of head, sharp/stabbing, lasts for 3 days no OTC meds help never has tried RX med in past, was nervous about SE sending generic Imitrex nasal spray along with Naproxen suspension (pt unable to swallow pills), advised on use & SE. f/u 6 mos or prn

## 2022-07-12 ENCOUNTER — Ambulatory Visit (INDEPENDENT_AMBULATORY_CARE_PROVIDER_SITE_OTHER): Payer: BC Managed Care – PPO | Admitting: Family

## 2022-07-12 VITALS — BP 127/74 | HR 74 | Temp 98.0°F | Ht 65.0 in | Wt 128.8 lb

## 2022-07-12 DIAGNOSIS — Z1231 Encounter for screening mammogram for malignant neoplasm of breast: Secondary | ICD-10-CM

## 2022-07-12 DIAGNOSIS — Z1211 Encounter for screening for malignant neoplasm of colon: Secondary | ICD-10-CM

## 2022-07-12 DIAGNOSIS — Z114 Encounter for screening for human immunodeficiency virus [HIV]: Secondary | ICD-10-CM

## 2022-07-12 DIAGNOSIS — Z1159 Encounter for screening for other viral diseases: Secondary | ICD-10-CM | POA: Diagnosis not present

## 2022-07-12 DIAGNOSIS — Z0000 Encounter for general adult medical examination without abnormal findings: Secondary | ICD-10-CM

## 2022-07-12 LAB — CBC WITH DIFFERENTIAL/PLATELET
Basophils Absolute: 0.1 10*3/uL (ref 0.0–0.1)
Basophils Relative: 1.1 % (ref 0.0–3.0)
Eosinophils Absolute: 0.2 10*3/uL (ref 0.0–0.7)
Eosinophils Relative: 4.8 % (ref 0.0–5.0)
HCT: 42.6 % (ref 36.0–46.0)
Hemoglobin: 14.2 g/dL (ref 12.0–15.0)
Lymphocytes Relative: 25.3 % (ref 12.0–46.0)
Lymphs Abs: 1.3 10*3/uL (ref 0.7–4.0)
MCHC: 33.4 g/dL (ref 30.0–36.0)
MCV: 89.8 fl (ref 78.0–100.0)
Monocytes Absolute: 0.3 10*3/uL (ref 0.1–1.0)
Monocytes Relative: 6 % (ref 3.0–12.0)
Neutro Abs: 3.1 10*3/uL (ref 1.4–7.7)
Neutrophils Relative %: 62.8 % (ref 43.0–77.0)
Platelets: 234 10*3/uL (ref 150.0–400.0)
RBC: 4.75 Mil/uL (ref 3.87–5.11)
RDW: 12.6 % (ref 11.5–15.5)
WBC: 5 10*3/uL (ref 4.0–10.5)

## 2022-07-12 LAB — COMPREHENSIVE METABOLIC PANEL
ALT: 14 U/L (ref 0–35)
AST: 17 U/L (ref 0–37)
Albumin: 4.5 g/dL (ref 3.5–5.2)
Alkaline Phosphatase: 58 U/L (ref 39–117)
BUN: 13 mg/dL (ref 6–23)
CO2: 29 mEq/L (ref 19–32)
Calcium: 9.4 mg/dL (ref 8.4–10.5)
Chloride: 102 mEq/L (ref 96–112)
Creatinine, Ser: 0.7 mg/dL (ref 0.40–1.20)
GFR: 98.66 mL/min (ref >60.00)
Glucose, Bld: 103 mg/dL — ABNORMAL HIGH (ref 70–99)
Potassium: 3.7 mEq/L (ref 3.5–5.1)
Sodium: 140 mEq/L (ref 135–145)
Total Bilirubin: 0.6 mg/dL (ref 0.2–1.2)
Total Protein: 7.2 g/dL (ref 6.0–8.3)

## 2022-07-12 LAB — LIPID PANEL
Cholesterol: 335 mg/dL — ABNORMAL HIGH (ref 0–200)
HDL: 56.6 mg/dL (ref >39.00)
LDL Cholesterol: 240 mg/dL — ABNORMAL HIGH (ref 0–99)
NonHDL: 278.66
Total CHOL/HDL Ratio: 6
Triglycerides: 195 mg/dL — ABNORMAL HIGH (ref 0.0–149.0)
VLDL: 39 mg/dL (ref 0.0–40.0)

## 2022-07-12 LAB — TSH: TSH: 2.12 u[IU]/mL (ref 0.35–5.50)

## 2022-07-12 NOTE — Patient Instructions (Addendum)
It was very nice to see you today!   I will review your lab results via MyChart in a few days.   Have a great week!    PLEASE NOTE:  If you had any lab tests please let us know if you have not heard back within a few days. You may see your results on MyChart before we have a chance to review them but we will give you a call once they are reviewed by us. If we ordered any referrals today, please let us know if you have not heard from their office within the next week.    

## 2022-07-12 NOTE — Progress Notes (Signed)
Phone [phone]  Subjective:   Patient is a 52 y.o. female presenting for annual physical.    Chief Complaint  Patient presents with   Annual Exam    Fasting w/ labs    See problem oriented charting- ROS- full  review of systems was completed and negative.   The following were reviewed and entered/updated in epic: No past medical history on file. Patient Active Problem List   Diagnosis Date Noted   Migraine without aura and without status migrainosus, not intractable 06/29/2022   Neck pain 02/21/2018   Past Surgical History:  Procedure Laterality Date   LEEP     TONSILLECTOMY      Family History  Problem Relation Age of Onset   Breast cancer Maternal Grandmother    Heart disease Mother     Medications- reviewed and updated Current Outpatient Medications  Medication Sig Dispense Refill   naproxen (NAPROSYN) 125 MG/5ML suspension Take 20 mLs (500 mg total) by mouth as directed. Take 20ml with sumatriptan nasal spray for migraines. Can repeat dose in 12 hours if needed. 150 mL 0   SUMAtriptan 10 MG/ACT SOLN Place 1 spray into the nose as directed. Spray into one nostril only. Repeat in 2 hours if needed, max dose of 4 sprays in 24 hours. 1 each 2   No current facility-administered medications for this visit.    Allergies-reviewed and updated Allergies  Allergen Reactions   Codeine Rash   Doxycycline Rash   Latex Rash   Penicillins Rash    Social History   Social History Narrative   Not on file    Objective:  Ht 5\' 5"  (1.651 m)   Wt 128 lb 12.8 oz (58.4 kg)   BMI 21.43 kg/m  Physical Exam Vitals and nursing note reviewed.  Constitutional:      Appearance: Normal appearance.  HENT:     Head: Normocephalic.     Right Ear: Tympanic membrane normal.     Left Ear: Tympanic membrane normal.     Nose: Nose normal.     Mouth/Throat:     Mouth: Mucous membranes are moist.  Eyes:     Pupils: Pupils are equal, round, and reactive to light.   Cardiovascular:     Rate and Rhythm: Normal rate and regular rhythm.  Pulmonary:     Effort: Pulmonary effort is normal.     Breath sounds: Normal breath sounds.  Musculoskeletal:        General: Normal range of motion.     Cervical back: Normal range of motion.  Lymphadenopathy:     Cervical: No cervical adenopathy.  Skin:    General: Skin is warm and dry.  Neurological:     Mental Status: She is alert.  Psychiatric:        Mood and Affect: Mood normal.        Behavior: Behavior normal.      Assessment and Plan   Health Maintenance counseling: 1. Anticipatory guidance: Patient counseled regarding regular dental exams q6 months, eye exams,  avoiding smoking and second hand smoke, limiting alcohol to 1 beverage per day, no illicit drugs.   2. Risk factor reduction:  Advised patient of need for regular exercise and diet rich with fruits and vegetables to reduce risk of heart attack and stroke. Exercise- walking.  Wt Readings from Last 3 Encounters:  07/12/22 128 lb 12.8 oz (58.4 kg)  06/29/22 129 lb 2 oz (58.6 kg)  12/20/20 125 lb (56.7 kg)   3. Immunizations/screenings/ancillary  studies  There is no immunization history on file for this patient. Health Maintenance Due  Topic Date Due   Hepatitis C Screening  Never done    4. Cervical cancer screening- due 2025 5. Breast cancer screening-  mammogram 2023 6. Colon cancer screening - not done yet, pt will consider, no risk factors   7. Skin cancer screening- advised regular sunscreen use. Denies worrisome, changing, or new skin lesions.  8. Birth control/STD check- N/A 9. Osteoporosis screening- N/A 10. Alcohol screening: none 11. Smoking associated screening (lung cancer screening, AAA screen 65-75, UA)- non- smoker  Annual physical exam - pt requesting to know her blood type, advised this is not a covered lab and she will need to pay out of pocket, pt verbalized understanding.  -     CBC with Differential/Platelet -      Comprehensive metabolic panel -     Lipid panel -     TSH  Need for hepatitis C screening test -     Hepatitis C antibody   Recommended follow up:  Return for any future concerns. No future appointments.  Lab/Order associations: fasting   Dulce Sellar, NP

## 2022-07-13 LAB — HEPATITIS C ANTIBODY: Hepatitis C Ab: NONREACTIVE

## 2022-07-14 ENCOUNTER — Encounter: Payer: Self-pay | Admitting: Family

## 2022-07-14 DIAGNOSIS — E782 Mixed hyperlipidemia: Secondary | ICD-10-CM | POA: Insufficient documentation

## 2022-08-23 DIAGNOSIS — L820 Inflamed seborrheic keratosis: Secondary | ICD-10-CM | POA: Diagnosis not present

## 2022-08-23 DIAGNOSIS — L298 Other pruritus: Secondary | ICD-10-CM | POA: Diagnosis not present

## 2022-08-23 DIAGNOSIS — L538 Other specified erythematous conditions: Secondary | ICD-10-CM | POA: Diagnosis not present

## 2022-08-23 DIAGNOSIS — D492 Neoplasm of unspecified behavior of bone, soft tissue, and skin: Secondary | ICD-10-CM | POA: Diagnosis not present

## 2022-10-19 DIAGNOSIS — D225 Melanocytic nevi of trunk: Secondary | ICD-10-CM | POA: Diagnosis not present

## 2022-10-19 DIAGNOSIS — L720 Epidermal cyst: Secondary | ICD-10-CM | POA: Diagnosis not present

## 2022-10-19 DIAGNOSIS — L821 Other seborrheic keratosis: Secondary | ICD-10-CM | POA: Diagnosis not present

## 2022-10-19 DIAGNOSIS — L814 Other melanin hyperpigmentation: Secondary | ICD-10-CM | POA: Diagnosis not present

## 2022-10-19 DIAGNOSIS — L700 Acne vulgaris: Secondary | ICD-10-CM | POA: Diagnosis not present

## 2023-02-22 ENCOUNTER — Encounter: Payer: Self-pay | Admitting: Family

## 2023-02-22 ENCOUNTER — Telehealth: Payer: Self-pay

## 2023-02-22 ENCOUNTER — Ambulatory Visit: Payer: BC Managed Care – PPO | Admitting: Family

## 2023-02-22 VITALS — BP 129/72 | HR 65 | Temp 97.3°F | Ht 65.0 in | Wt 147.0 lb

## 2023-02-22 DIAGNOSIS — E782 Mixed hyperlipidemia: Secondary | ICD-10-CM | POA: Diagnosis not present

## 2023-02-22 DIAGNOSIS — G43009 Migraine without aura, not intractable, without status migrainosus: Secondary | ICD-10-CM

## 2023-02-22 MED ORDER — NAPROXEN 125 MG/5ML PO SUSP
500.0000 mg | ORAL | 0 refills | Status: DC
Start: 2023-02-22 — End: 2023-02-26

## 2023-02-22 NOTE — Telephone Encounter (Signed)
Copied from CRM [id number]. Topic: Clinical - Prescription Issue >> Feb 22, 2023  3:23 PM Elizebeth Brooking wrote: Reason for CRM: Patient called in regarding naproxen (NAPROSYN) 125 MG/5ML suspension stated pharmacy and insurance stated that a prior authorization is required  Please start PA for pt.  Thank you!

## 2023-02-22 NOTE — Addendum Note (Signed)
Addended by: Lorn Junes on: 02/22/2023 03:44 PM   Modules accepted: Orders

## 2023-02-22 NOTE — Progress Notes (Signed)
Patient ID: Loretta Walker, female    DOB: 1970/05/12, 53 y.o.   MRN: [id number]  Chief Complaint  Patient presents with   Migraine    Pt would like medication refill of Naproxen.       Discussed the use of AI scribe software for clinical note transcription with the patient, who gave verbal consent to proceed.  History of Present Illness   The patient presents for migraine medication refills. She has been using liquid naproxen since her last visit, which helps reduce the intensity of the migraines but takes a while to work. She has not yet tried the prescribed nasal Imitrex due to nervousness. Migraines have not decreased in frequency since the last visit, occurring approximately once a month, but duration is less, as used to last 3 days. Potential triggers for her migraines include stress, dehydration, skipping meals, and allergies. A recent migraine was possibly triggered by outdoor activity. She is mindful of these triggers and tries to manage them. She has a history of high cholesterol, which she believes is hereditary, as her father also has high cholesterol. She has not made significant dietary changes, as she already follows a healthy diet, having grown up eating organic and vegetarian foods.     Component Value Date/Time   CHOL 335 (H) 07/12/2022 0851   TRIG 195.0 (H) 07/12/2022 0851   HDL 56.60 07/12/2022 0851   CHOLHDL 6 07/12/2022 0851   VLDL 39.0 07/12/2022 0851   LDLCALC 240 (H) 07/12/2022 0851      Assessment & Plan:     Migraine - Patient reports reduced frequency and intensity of migraines with use of liquid Naproxen. Patient has not yet tried Imitrex nasal spray due to apprehension. -Encouraged patient to try Imitrex nasal spray at onset of next migraine along with the Naproxen. -Continue liquid Naproxen as needed for migraines, refill sent today. -Follow up in 6 mos.  Mixed Hyperlipidemia - High TC, triglycerides and LDL in 06/2022. Patient has a family history  of high cholesterol and previous lab results indicated high cholesterol levels. Patient reports a healthy diet and lifestyle and BMI of 24. -Order repeat Lipids with Apolipoprotein B to investigate potential genetic link. -Discussed potential for coronary calcium scan to assess for coronary artery disease. Provided patient with information to consider. -Continue to advise on low saturated fat diet along with exercise as many days as able.     Subjective:    Outpatient Medications Prior to Visit  Medication Sig Dispense Refill   SUMAtriptan 10 MG/ACT SOLN Place 1 spray into the nose as directed. Spray into one nostril only. Repeat in 2 hours if needed, max dose of 4 sprays in 24 hours. 1 each 2   naproxen (NAPROSYN) 125 MG/5ML suspension Take 20 mLs (500 mg total) by mouth as directed. Take 20ml with sumatriptan nasal spray for migraines. Can repeat dose in 12 hours if needed. 150 mL 0   No facility-administered medications prior to visit.   No past medical history on file. Past Surgical History:  Procedure Laterality Date   LEEP     TONSILLECTOMY     Allergies  Allergen Reactions   Coconut Flavor [Flavoring Agent] Other (See Comments)    Abdominal cramping.    Codeine Rash   Doxycycline Rash   Latex Rash   Penicillins Rash      Objective:    Physical Exam Vitals and nursing note reviewed.  Constitutional:      Appearance: Normal appearance.  Cardiovascular:  Rate and Rhythm: Normal rate and regular rhythm.  Pulmonary:     Effort: Pulmonary effort is normal.     Breath sounds: Normal breath sounds.  Musculoskeletal:        General: Normal range of motion.  Skin:    General: Skin is warm and dry.  Neurological:     Mental Status: She is alert.  Psychiatric:        Mood and Affect: Mood normal.        Behavior: Behavior normal.    BP 129/72 (BP Location: Left Arm, Patient Position: Sitting, Cuff Size: Large)   Pulse 65   Temp (!) 97.3 F (36.3 C) (Temporal)    Ht 5\' 5"  (1.651 m)   Wt 147 lb (66.7 kg)   SpO2 98%   BMI 24.46 kg/m  Wt Readings from Last 3 Encounters:  02/22/23 147 lb (66.7 kg)  07/12/22 128 lb 12.8 oz (58.4 kg)  06/29/22 129 lb 2 oz (58.6 kg)      Dulce Sellar, NP

## 2023-02-22 NOTE — Assessment & Plan Note (Addendum)
Patient reports reduced frequency and intensity of migraines with use of liquid Naproxen. Patient has not yet tried Imitrex nasal spray due to apprehension. -Encouraged patient to try Imitrex nasal spray at onset of next migraine along with the Naproxen. -Continue liquid Naproxen as needed for migraines, refill sent today. -Follow up in 6 mos.

## 2023-02-22 NOTE — Assessment & Plan Note (Signed)
High TC, triglycerides and LDL in 06/2022. Patient has a family history of high cholesterol and previous lab results indicated high cholesterol levels. Patient reports a healthy diet and lifestyle and BMI of 24. -Order repeat Lipids with Apolipoprotein B to investigate potential genetic link. -Discussed potential for coronary calcium scan to assess for coronary artery disease. Provided patient with information to consider. -Continue to advise on low saturated fat diet along with exercise as many days as able.

## 2023-02-23 ENCOUNTER — Other Ambulatory Visit (HOSPITAL_COMMUNITY): Payer: Self-pay

## 2023-02-23 ENCOUNTER — Telehealth: Payer: Self-pay

## 2023-02-23 LAB — LIPID PANEL
Chol/HDL Ratio: 5.5 {ratio} — ABNORMAL HIGH (ref 0.0–4.4)
Cholesterol, Total: 338 mg/dL — ABNORMAL HIGH (ref 100–199)
HDL: 62 mg/dL (ref >39)
LDL Chol Calc (NIH): 239 mg/dL — ABNORMAL HIGH (ref 0–99)
Triglycerides: 188 mg/dL — ABNORMAL HIGH (ref 0–149)
VLDL Cholesterol Cal: 37 mg/dL (ref 5–40)

## 2023-02-23 LAB — APOLIPOPROTEIN B: Apolipoprotein B: 179 mg/dL — ABNORMAL HIGH (ref <90)

## 2023-02-23 NOTE — Telephone Encounter (Signed)
I called pt and LVM in regards to cancellation. [facility] may need to [facility] for a lower co-pay.

## 2023-02-23 NOTE — Telephone Encounter (Signed)
Pharmacy Patient Advocate Encounter   Received notification from Pt Calls Messages that prior authorization for Naproxen 125mg /19ml suspension is required/requested.   Insurance verification completed.   The patient is insured through [employer] [place]  .   Per test claim: The current 8 day co-pay is, $11.90.  No PA needed at this time. This test claim was processed through [facility]- copay amounts may vary at other pharmacies due to pharmacy/plan contracts, or as the patient moves through the different stages of their insurance plan.

## 2023-02-25 ENCOUNTER — Encounter: Payer: Self-pay | Admitting: Family

## 2023-02-26 ENCOUNTER — Telehealth: Payer: Self-pay

## 2023-02-26 ENCOUNTER — Other Ambulatory Visit (HOSPITAL_COMMUNITY): Payer: Self-pay

## 2023-02-26 ENCOUNTER — Other Ambulatory Visit: Payer: Self-pay

## 2023-02-26 DIAGNOSIS — G43009 Migraine without aura, not intractable, without status migrainosus: Secondary | ICD-10-CM

## 2023-02-26 MED ORDER — NAPROXEN 125 MG/5ML PO SUSP
500.0000 mg | ORAL | 0 refills | Status: AC
  Filled 2023-02-26 – 2023-02-27 (×2): qty 150, 4d supply, fill #0

## 2023-02-26 NOTE — Telephone Encounter (Signed)
Pharmacy Patient Advocate Encounter   Received notification from Pt Calls Messages that prior authorization for Naproxen 125mg /76ml is required/requested.   Insurance verification completed.   The patient is insured through [employer] .   Per test claim: PA required; PA submitted to above mentioned insurance via CoverMyMeds Key/confirmation #/EOC [id number] Status is pending   Placed a call to [facility]. I asked the pharmacist to see if they were billing the same insurance we had, she said she was billing [employer] and it keeps saying it needs a prior auth and I let her know I would submit it.

## 2023-02-26 NOTE — Telephone Encounter (Signed)
PA request sent to PA team for prior authorization.

## 2023-02-26 NOTE — Telephone Encounter (Signed)
Copied from CRM [id number]. Topic: Clinical - Prescription Issue >> Feb 26, 2023 11:37 AM Fredrich Romans wrote: Reason for CRM: patient would like to know if she could have prescription naproxen (NAPROSYN) 125 MG[facility]ML [facility] of this [facility].  Rx has been sent to pharmacy

## 2023-02-26 NOTE — Telephone Encounter (Signed)
I called and spoke with pt's pharmacy [facility] and [facility]. Both pharmacies are stating medication needs a PA. I verified insurance with pt and pharmacy. Please advise.

## 2023-02-27 ENCOUNTER — Other Ambulatory Visit: Payer: Self-pay

## 2023-03-01 ENCOUNTER — Other Ambulatory Visit (HOSPITAL_COMMUNITY): Payer: Self-pay

## 2023-03-01 NOTE — Telephone Encounter (Signed)
 Copied from CRM [id number]. Topic: Clinical - Prescription Issue >> Feb 28, 2023  4:19 PM Alfonso ORN wrote: Reason for CRM: patient spoke with [facility] regarding prior  prior authorization  for  naproxen  (NAPROSYN ) 125 MG/5ML suspension Checking on the status been over a week now  Please call patient back on status  Hi, Have you heard anything about pt's PA ?   Thank you!

## 2023-03-01 NOTE — Telephone Encounter (Signed)
 Pharmacy Patient Advocate Encounter  Received notification from [facility]   that Prior Authorization for Naproxen  125mg /69ml suspension has been APPROVED from 01/28/2023 to 02/27/24   PA #/Case ID/Reference #: [id number]

## 2023-03-01 NOTE — Telephone Encounter (Signed)
 FYI,  I called pt in regards to approval, Pt verbalized understanding.

## 2023-04-18 DIAGNOSIS — E785 Hyperlipidemia, unspecified: Secondary | ICD-10-CM | POA: Diagnosis not present

## 2023-04-18 DIAGNOSIS — Z1211 Encounter for screening for malignant neoplasm of colon: Secondary | ICD-10-CM | POA: Diagnosis not present

## 2023-04-18 DIAGNOSIS — Z124 Encounter for screening for malignant neoplasm of cervix: Secondary | ICD-10-CM | POA: Diagnosis not present

## 2023-04-18 DIAGNOSIS — Z7689 Persons encountering health services in other specified circumstances: Secondary | ICD-10-CM | POA: Diagnosis not present

## 2023-04-18 DIAGNOSIS — G43009 Migraine without aura, not intractable, without status migrainosus: Secondary | ICD-10-CM | POA: Diagnosis not present

## 2023-05-03 DIAGNOSIS — F4322 Adjustment disorder with anxiety: Secondary | ICD-10-CM | POA: Diagnosis not present

## 2023-05-25 DIAGNOSIS — F4322 Adjustment disorder with anxiety: Secondary | ICD-10-CM | POA: Diagnosis not present

## 2023-06-08 DIAGNOSIS — F4322 Adjustment disorder with anxiety: Secondary | ICD-10-CM | POA: Diagnosis not present

## 2023-06-21 DIAGNOSIS — F4322 Adjustment disorder with anxiety: Secondary | ICD-10-CM | POA: Diagnosis not present

## 2023-07-02 DIAGNOSIS — F4322 Adjustment disorder with anxiety: Secondary | ICD-10-CM | POA: Diagnosis not present

## 2023-07-09 DIAGNOSIS — F4322 Adjustment disorder with anxiety: Secondary | ICD-10-CM | POA: Diagnosis not present

## 2023-07-16 DIAGNOSIS — F4322 Adjustment disorder with anxiety: Secondary | ICD-10-CM | POA: Diagnosis not present

## 2023-07-25 DIAGNOSIS — F4322 Adjustment disorder with anxiety: Secondary | ICD-10-CM | POA: Diagnosis not present

## 2023-08-02 DIAGNOSIS — F4322 Adjustment disorder with anxiety: Secondary | ICD-10-CM | POA: Diagnosis not present

## 2023-08-20 DIAGNOSIS — F4322 Adjustment disorder with anxiety: Secondary | ICD-10-CM | POA: Diagnosis not present

## 2023-09-04 DIAGNOSIS — F4322 Adjustment disorder with anxiety: Secondary | ICD-10-CM | POA: Diagnosis not present

## 2023-09-14 DIAGNOSIS — F4322 Adjustment disorder with anxiety: Secondary | ICD-10-CM | POA: Diagnosis not present

## 2023-09-25 DIAGNOSIS — F4322 Adjustment disorder with anxiety: Secondary | ICD-10-CM | POA: Diagnosis not present

## 2023-10-12 DIAGNOSIS — F4322 Adjustment disorder with anxiety: Secondary | ICD-10-CM | POA: Diagnosis not present

## 2023-10-19 DIAGNOSIS — F4322 Adjustment disorder with anxiety: Secondary | ICD-10-CM | POA: Diagnosis not present

## 2023-10-25 DIAGNOSIS — L814 Other melanin hyperpigmentation: Secondary | ICD-10-CM | POA: Diagnosis not present

## 2023-10-25 DIAGNOSIS — L821 Other seborrheic keratosis: Secondary | ICD-10-CM | POA: Diagnosis not present

## 2023-10-25 DIAGNOSIS — D1801 Hemangioma of skin and subcutaneous tissue: Secondary | ICD-10-CM | POA: Diagnosis not present

## 2023-10-25 DIAGNOSIS — L739 Follicular disorder, unspecified: Secondary | ICD-10-CM | POA: Diagnosis not present

## 2023-10-29 DIAGNOSIS — F4322 Adjustment disorder with anxiety: Secondary | ICD-10-CM | POA: Diagnosis not present

## 2023-11-09 DIAGNOSIS — F4322 Adjustment disorder with anxiety: Secondary | ICD-10-CM | POA: Diagnosis not present

## 2023-11-16 DIAGNOSIS — F4322 Adjustment disorder with anxiety: Secondary | ICD-10-CM | POA: Diagnosis not present

## 2023-11-30 DIAGNOSIS — F4322 Adjustment disorder with anxiety: Secondary | ICD-10-CM | POA: Diagnosis not present

## 2023-12-13 DIAGNOSIS — F4322 Adjustment disorder with anxiety: Secondary | ICD-10-CM | POA: Diagnosis not present

## 2023-12-25 DIAGNOSIS — F4322 Adjustment disorder with anxiety: Secondary | ICD-10-CM | POA: Diagnosis not present

## 2024-01-07 DIAGNOSIS — F4322 Adjustment disorder with anxiety: Secondary | ICD-10-CM | POA: Diagnosis not present

## 2024-01-21 DIAGNOSIS — F4322 Adjustment disorder with anxiety: Secondary | ICD-10-CM | POA: Diagnosis not present
